# Patient Record
Sex: Female | Born: 1955 | Race: White | Hispanic: No | Marital: Married
Health system: Southern US, Community
[De-identification: ages and names within clinical notes are randomized; demographics above are authoritative.]

---

## 2012-06-26 ENCOUNTER — Other Ambulatory Visit: Payer: Self-pay | Admitting: Family Medicine

## 2012-06-26 DIAGNOSIS — Z1231 Encounter for screening mammogram for malignant neoplasm of breast: Secondary | ICD-10-CM

## 2012-06-26 DIAGNOSIS — N951 Menopausal and female climacteric states: Secondary | ICD-10-CM

## 2012-08-04 ENCOUNTER — Ambulatory Visit
Admission: RE | Admit: 2012-08-04 | Discharge: 2012-08-04 | Disposition: A | Discharge status: Home / Self Care | Payer: BC Managed Care – PPO | Source: Ambulatory Visit | Attending: Family Medicine | Admitting: Family Medicine

## 2012-08-04 ENCOUNTER — Ambulatory Visit: Payer: Self-pay

## 2012-08-04 DIAGNOSIS — N951 Menopausal and female climacteric states: Secondary | ICD-10-CM

## 2012-08-04 DIAGNOSIS — Z1231 Encounter for screening mammogram for malignant neoplasm of breast: Secondary | ICD-10-CM

## 2014-06-10 ENCOUNTER — Other Ambulatory Visit: Payer: Self-pay | Admitting: Internal Medicine

## 2014-06-10 DIAGNOSIS — M810 Age-related osteoporosis without current pathological fracture: Secondary | ICD-10-CM

## 2014-06-10 DIAGNOSIS — Z1231 Encounter for screening mammogram for malignant neoplasm of breast: Secondary | ICD-10-CM

## 2014-08-06 ENCOUNTER — Ambulatory Visit
Admission: RE | Admit: 2014-08-06 | Discharge: 2014-08-06 | Disposition: A | Discharge status: Home / Self Care | Payer: BC Managed Care – PPO | Source: Ambulatory Visit | Attending: Internal Medicine | Admitting: Internal Medicine

## 2014-08-06 DIAGNOSIS — M810 Age-related osteoporosis without current pathological fracture: Secondary | ICD-10-CM

## 2014-08-06 DIAGNOSIS — Z1231 Encounter for screening mammogram for malignant neoplasm of breast: Secondary | ICD-10-CM

## 2017-05-06 ENCOUNTER — Other Ambulatory Visit: Payer: Self-pay | Admitting: Family Medicine

## 2017-05-06 DIAGNOSIS — M810 Age-related osteoporosis without current pathological fracture: Secondary | ICD-10-CM

## 2017-05-08 ENCOUNTER — Encounter (INDEPENDENT_AMBULATORY_CARE_PROVIDER_SITE_OTHER): Payer: Self-pay

## 2017-05-08 ENCOUNTER — Other Ambulatory Visit: Payer: Self-pay | Admitting: Family Medicine

## 2017-05-08 ENCOUNTER — Ambulatory Visit
Admission: RE | Admit: 2017-05-08 | Discharge: 2017-05-08 | Disposition: A | Discharge status: Home / Self Care | Payer: BLUE CROSS/BLUE SHIELD | Source: Ambulatory Visit | Attending: Family Medicine | Admitting: Family Medicine

## 2017-05-08 DIAGNOSIS — M79672 Pain in left foot: Secondary | ICD-10-CM

## 2017-05-08 DIAGNOSIS — M810 Age-related osteoporosis without current pathological fracture: Secondary | ICD-10-CM

## 2017-05-22 ENCOUNTER — Other Ambulatory Visit: Payer: Self-pay

## 2017-05-31 ENCOUNTER — Ambulatory Visit (INDEPENDENT_AMBULATORY_CARE_PROVIDER_SITE_OTHER): Payer: BLUE CROSS/BLUE SHIELD | Admitting: Sports Medicine

## 2017-05-31 DIAGNOSIS — M19072 Primary osteoarthritis, left ankle and foot: Secondary | ICD-10-CM

## 2017-05-31 NOTE — Progress Notes (Signed)
Chief complaint: Left foot pain 6 weeks  History of present illness: Yolanda Gonzalez is a 61 year old female who presents to the sports medicine office today with chief complaint of left dorsal foot pain. She reports that symptoms have been ongoing for the last 6 weeks. She points to an incident that occurred 6 weeks ago. She reports that she was putting on a new pair of dress boots at time of incidence. She reports the same day noticing gradual onset and worsening of pain on the dorsal aspect of her left foot. Over the last 6 weeks she describes the pain as a sharp and throbbing pain. She reports any type pressure applied in the left foot is when she feels the pain. She does not report of any numbness, tingling, or burning parasthesias. She reports that she did follow up with her primary physician's office. She reports that x-ray was done, was told that she had osteoarthritis. Due to the pain she was having she placed in a CAM walker boot. She reports that the CAM walker boot made symptoms worse. She reports that she return to her primary physician's office, was placed in postoperative shoe. Repeat x-rays were done, she was kindly referred subsequently here for further follow-up of her foot pain. She reports that she has been mainly resting and not doing much walking in the last week. She overall feels symptoms are getting better and actually contemplated of canceling the appointment today. She reports having effusion, but has dramatically improved over the last week. She does not report of any fevers, chills, or night sweats. On same foot she does have history of bunionectomy, also had history of left 3rd middle phalangeal stress fracture several years ago. She reports that pain is in similar area today. She does have known history of osteoporosis, has previously been on bisphosphonates. This was stopped a few years ago and now she is just on calcium and vitamin D supplements. She recently had DEXA scan, she reports  that her T-scores have remained unchanged.  Review of systems:  As stated above  Her past medical history, surgical history, family history, and social history obtained reviewed. 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 Family history is noncontributory to current complaint. She does not report of any current tobacco use.  Physical exam: Vital signs are reviewed and are documented in the chart Gen.: Alert, oriented, appears stated age, in no apparent distress HEENT: Moist oral mucosa Respiratory: Normal respirations, able to speak in full sentences Cardiac: Regular rate, distal pulses 2+ Integumentary: No rashes on visible skin:  Neurologic: Strength 5/5, sensation 2+ in bilateral lower extremities Psych: Normal affect, mood is described as good Musculoskeletal: Inspection of her left foot reveals no obvious deformity or muscle atrophy, she does have trace effusion on the dorsal aspect of her left foot, otherwise no warmth, erythema, ecchymosis, she is tender to palpation in the midfoot of the left foot near the proximal second and third metatarsal as well as cuneiform bones, this is just from dorsal aspect, no tenderness to palpation plantar aspect of foot, no tenderness to palpation over Lisfranc, navicular, or base of fifth metatarsal, she does have full ankle passive and active of range of motion, she also does have flattening transverse arch bilaterally, also noticed that the fourth and fifth digit in both feet are not fully touching the  ground and 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 Based on limited musculoskeletal ultrasound evaluation, was evidence of midfoot osteoarthritis, with slight  spurring seen in the midfoot. No evidence to suggest stress fracture.  Impression: Midfoot osteoarthritis  Assessment and plan: 1. Left foot pain, suspect from midfoot osteoarthritis 2. Bilateral flattened transverse arch  Left foot pain -Did personally review x-rays that were done in the office at her primary care physician's office 3 weeks ago, do not see any evidence of stress fracture, given the timeframe of her symptoms would've expected to see a stress fracture at the time of that x-ray -Suspect her symptoms are coming from midfoot osteoarthritis -Did have patient fitted with green insole, with metatarsal pad on left foot, this was fitted to her satisfaction -She was given note for work to wear tennis shoes for the next 6 weeks  She will follow-up on as-needed basis if no interval improvement in her symptoms.  Haynes Kerns, M.D. Primary Care Sports Medicine Fellow [facility]

## 2018-09-29 ENCOUNTER — Other Ambulatory Visit: Payer: Self-pay | Admitting: Family Medicine

## 2018-09-29 DIAGNOSIS — Z1231 Encounter for screening mammogram for malignant neoplasm of breast: Secondary | ICD-10-CM

## 2018-10-29 ENCOUNTER — Ambulatory Visit: Payer: BLUE CROSS/BLUE SHIELD

## 2018-11-26 ENCOUNTER — Ambulatory Visit: Payer: BLUE CROSS/BLUE SHIELD

## 2019-03-10 ENCOUNTER — Ambulatory Visit: Payer: BLUE CROSS/BLUE SHIELD

## 2019-04-20 ENCOUNTER — Ambulatory Visit: Payer: BLUE CROSS/BLUE SHIELD

## 2019-06-03 ENCOUNTER — Ambulatory Visit
Admission: RE | Admit: 2019-06-03 | Discharge: 2019-06-03 | Disposition: A | Discharge status: Home / Self Care | Payer: BC Managed Care – PPO | Source: Ambulatory Visit | Attending: Family Medicine | Admitting: Family Medicine

## 2019-06-03 ENCOUNTER — Other Ambulatory Visit: Payer: Self-pay

## 2019-06-03 DIAGNOSIS — Z1231 Encounter for screening mammogram for malignant neoplasm of breast: Secondary | ICD-10-CM

## 2020-07-23 IMAGING — MG DIGITAL SCREENING BILAT W/ CAD
4 series · 4 of 4 positions shown · non-contrast
Comparison: Previous exam(s).

CLINICAL DATA: Screening.

EXAM:
DIGITAL SCREENING BILATERAL MAMMOGRAM WITH CAD

[L CC]
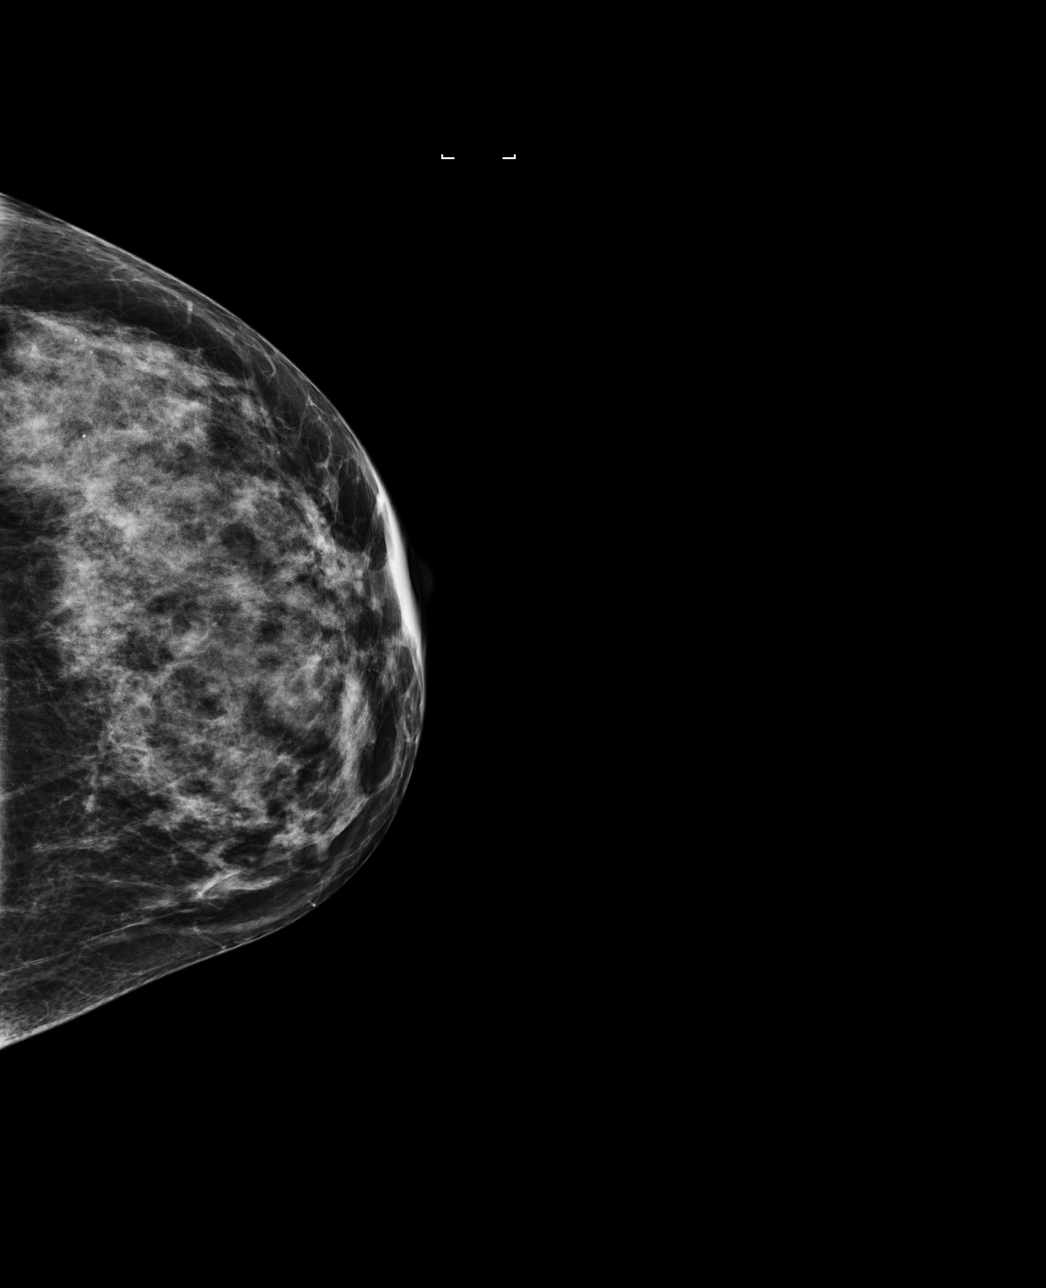

[R MLO]
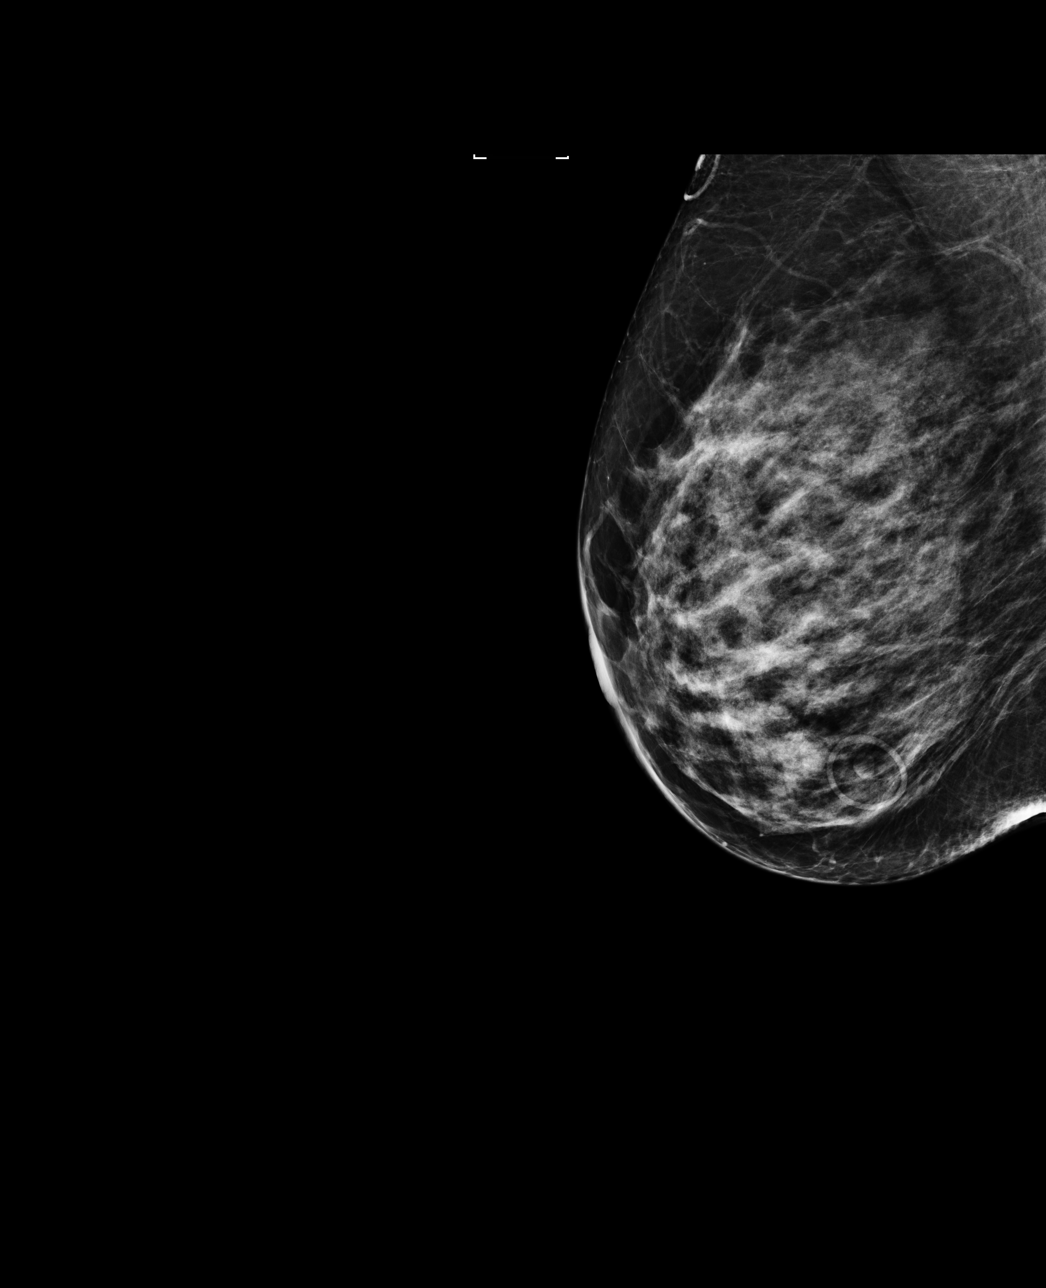

[L MLO]
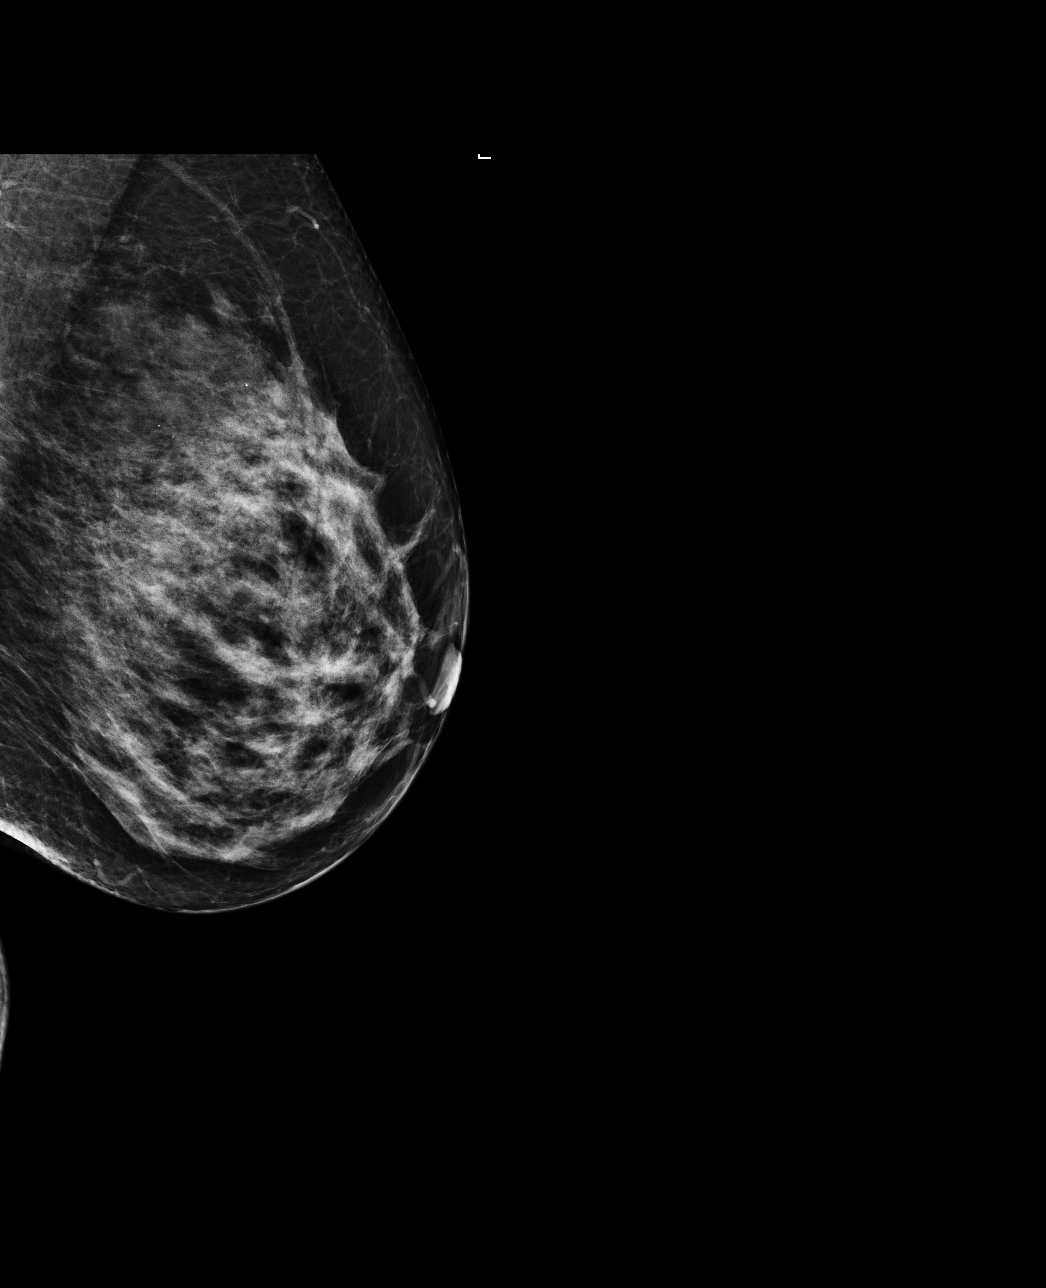

[R CC]
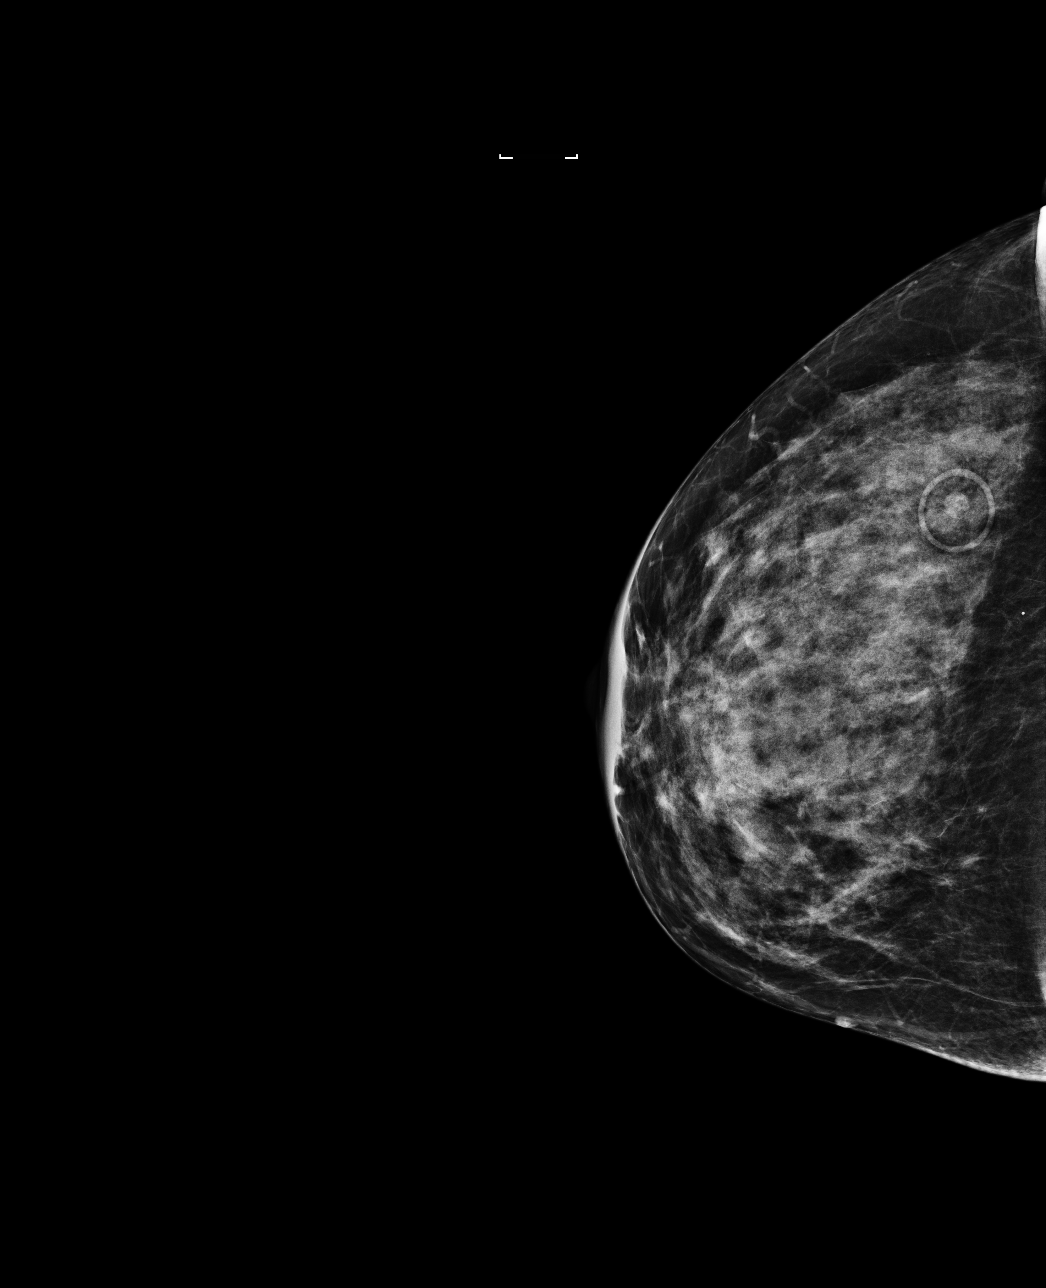

[4 of 4 positions shown; findings below may reference images not displayed]

ACR Breast Density Category d: The breast tissue is extremely dense,
which lowers the sensitivity of mammography
FINDINGS: There are no findings suspicious for malignancy. Images were
processed with CAD.
IMPRESSION: No mammographic evidence of malignancy. A result letter of this
screening mammogram will be mailed directly to the patient.

RECOMMENDATION:
Screening mammogram in one year. (Code:A5-2-HPS)

BI-RADS CATEGORY  1: Negative.

## 2023-07-22 ENCOUNTER — Encounter: Payer: Self-pay | Admitting: Physician Assistant

## 2023-08-10 NOTE — Progress Notes (Addendum)
[facility] Clinic Note - Initial Visit   Date: 08/13/23  Yolanda Gonzalez MRN: [id number] DOB: [birth year]   Acute right thalamic stroke, likely due to small vessel disease  Return to clinic in 3 months. Recommend good control of cardiovascular risk factors.  Patient is on Lipitor, she has not been on blood pressure medication which is recommended to maintain good normotensive state.  She was informed on her visit. Continue baby aspirin daily Discontinue any over-the-counter hormonal supplements Agree with compression stockings when traveling   History of Present Illness:  Yolanda Gonzalez is a 67 y.o. R-handed female with a history of hypothyroidism, fibromyalgia, osteoporosis seen today for evaluation of acute thalamic stroke. In review, on 07/15/23 she presented to the ED with acute R , sharp temporal headache followed by L sided arm and leg numbness and weakness, left facial droop while at a friend's house.  911 was called that she was evaluated at the ED.  At the ED she receive migraine cocktail, CT head and CTA head and neck where negative. MRI brain was remarkable for acute non hemorrhagic right thalamic capsular infarct measuring about 1 cm, with accelerated chronic microvascular ischemic disease.  2D echo was normal.  EKG without arrhythmia or acute findings. She was discharged on Plavix and baby ASA for 3 weeks then baby ASA alone.  She continues to experience mild numbness on the left side of her body from the left trunk to the left triceps area but she reports that this is improving.  She never had a stroke, but had "temple headaches for the last 1.5 y or 2 years ".  Denies any history of TIA. Denies vertigo but he "world was floating, was losing body control". She was nauseous and dizzy.  Denies  a history of headaches. During the presentation she had dysarthria, no dysphagia. No confusion. No seizures. Denies any chest pain, or shortness of  breath.  "My BP is high frequently when stressed ". Denies any fever or chills, or night sweats. No tobacco. No new meds or hormonal supplements. Takes over-the-counter hormone support which includes DHEA  ("Dr. Jonathon Resides").   She drives to [place] to her second home there, last trip 07/01/2023-07/06/23. Denies recent surgeries. No sick contacts. No new stressors present in personal life. Patient is compliant with her medications. Patient is very active, exercising daily. She is not a diabetic.  History of stroke in father and MGM. Patient was not administered TPA , but received Lovenox at the ED.   Out-side paper records, electronic medical record, and images have been reviewed where available and summarized as:    Pertinent labs TSH 1.340, sed rate 3, CRP 0.05, magnesium 2.3, troponin 4 (07/15/2023) glucose 131, CBC was unremarkable, total cholesterol 259, triglycerides 53, HDL 132.7, LDL 84, C-Met unremarkable,  CT of the head without contrast performed at [facility] ED was negative for acute findings other than chronic microvascular ischemic changes.  CT angio of the head and neck without LVO, or aneurysm.  Bilateral carotid arteries without significant stenosis or occlusion  MRI of the brain on 11/27/2024Showed acute nonhemorrhagic right thalamic capsular infarct measuring approximately 1 cm, and T2 FLAIR hyperintensity in the subcortical and periventricular white matter compatible with accelerated chronic microvascular ischemic disease.  She did have a  echo but the results are not available for review, pending follow.     Medications:  Outpatient Encounter Medications as of 08/13/2023  Medication Sig   aspirin EC 325 MG tablet Take  325 mg by mouth daily.   atorvastatin (LIPITOR) 40 MG tablet Take 40 mg by mouth at bedtime.   pantoprazole (PROTONIX) 40 MG tablet Take 40 mg by mouth daily.   No facility-administered encounter medications on file as of 08/13/2023.    Allergies:   Allergies  Allergen Reactions   Sulfa Antibiotics Hives    Family History: History reviewed. No pertinent family history.  Social History:   Social History   Social History Narrative   Right handed   Drinks caffeine   Lives with husband and two sons   Two floor home   retired    Vital Signs:  BP (!) 146/92   Pulse 81   Resp 18   Wt 133 lb (60.3 kg)   SpO2 98%   BMI 23.56 kg/m    General Medical Exam:   General:  Well appearing, comfortable.   Eyes/ENT: see cranial nerve examination.   Neck:   No carotid bruits. Respiratory:  Clear to auscultation, good air entry bilaterally.   Cardiac:  Regular rate and rhythm, no murmur.   Extremities:  No deformities, edema, or skin discoloration.  Skin:  No rashes or lesions.  Neurological Exam: MENTAL STATUS including orientation to time, place, person, recent and remote memory, attention span and concentration, language, and fund of knowledge is normal.  Speech is not dysarthric.  CRANIAL NERVES: II:  No visual field defects.  Unremarkable fundi.   III-IV-VI: Pupils equal round and reactive to light.  Normal conjugate, extra-ocular eye movements in all directions of gaze.  No nystagmus.  No ptosis .   V:  Normal facial sensation.    VII:  Normal facial symmetry and movements.   VIII:  Normal hearing and vestibular function.   IX-X:  Normal palatal movement.   XI:  Normal shoulder shrug and head rotation.   XII:  Normal tongue strength and range of motion, no deviation or fasciculation.  MOTOR:  No atrophy, fasciculations or abnormal movements.  No pronator drift.    SENSORY:  Normal and symmetric perception of light touch, pinprick, vibration, and proprioception.  Romberg's sign absent.   COORDINATION/GAIT: Normal finger-to- nose-finger and heel-to-shin.  Intact rapid alternating movements bilaterally.  Able to rise from a chair without using arms.  Gait narrow based and stable. Tandem and stressed gait intact.      Total time spent:  60 mis   Thank you for allowing me to participate in patient's care.  If I can answer any additional questions, I would be pleased to do so.    Sincerely,   Marlowe Kays, PA-C

## 2023-08-13 ENCOUNTER — Encounter: Payer: Self-pay | Admitting: Physician Assistant

## 2023-08-13 ENCOUNTER — Ambulatory Visit (INDEPENDENT_AMBULATORY_CARE_PROVIDER_SITE_OTHER): Payer: BC Managed Care – PPO | Admitting: Physician Assistant

## 2023-08-13 VITALS — BP 177/91 | HR 81 | Resp 18 | Wt 133.0 lb

## 2023-08-13 DIAGNOSIS — I639 Cerebral infarction, unspecified: Secondary | ICD-10-CM | POA: Insufficient documentation

## 2023-08-13 DIAGNOSIS — R200 Anesthesia of skin: Secondary | ICD-10-CM | POA: Diagnosis not present

## 2023-08-13 MED ORDER — ATORVASTATIN CALCIUM 40 MG PO TABS: 40.0000 mg | ORAL_TABLET | Freq: Every day | ORAL | 0 refills | Status: DC

## 2023-08-13 NOTE — Patient Instructions (Addendum)
Continue baby ASA Continue Lipitor daily Make sure BP is improved No more over the counter hormone suplements PT for strength and balance at [facility] 3 months

## 2023-09-04 ENCOUNTER — Other Ambulatory Visit: Payer: Self-pay | Admitting: Physician Assistant

## 2023-09-09 ENCOUNTER — Encounter: Payer: Self-pay | Admitting: Physician Assistant

## 2023-09-12 ENCOUNTER — Other Ambulatory Visit: Payer: Self-pay | Admitting: Family Medicine

## 2023-09-12 DIAGNOSIS — Z1231 Encounter for screening mammogram for malignant neoplasm of breast: Secondary | ICD-10-CM

## 2023-11-07 ENCOUNTER — Ambulatory Visit
Admission: RE | Admit: 2023-11-07 | Discharge: 2023-11-07 | Disposition: A | Discharge status: Home / Self Care | Source: Ambulatory Visit | Attending: Family Medicine | Admitting: Family Medicine

## 2023-11-07 DIAGNOSIS — Z1231 Encounter for screening mammogram for malignant neoplasm of breast: Secondary | ICD-10-CM

## 2023-11-07 NOTE — Progress Notes (Signed)
 [facility] Clinic Note - Follow up visit   Date: 11/08/23  Yolanda Gonzalez MRN: [id number] DOB: Jun 20, 1956   Acute right thalamic stroke, likely due to small vessel disease   Recommend good control of cardiovascular risk factors.   Patient is on Lipitor, she has not been on blood pressure medication which is recommended to maintain good normotensive state.She was informed of abnormal BP today    Continue baby aspirin daily. Discontinue any over-the-counter hormonal supplements. Agree with compression stockings when traveling. Recommend continuing PT/OT for L arm residual symptoms    History of Present Illness 08/13/23: Yolanda Gonzalez is a 68 y.o. R-handed female with a history of hypothyroidism, fibromyalgia, osteoporosis seen today for evaluation of acute thalamic stroke. In review, on 07/15/23 she presented to the ED with acute R , sharp temporal headache followed by L sided arm and leg numbness and weakness, left facial droop while at a friend's house.  911 was called that she was evaluated at the ED.  At the ED she received migraine cocktail, CT head and CTA head and neck where negative. MRI brain was remarkable for acute non hemorrhagic right thalamic capsular infarct measuring about 1 cm, with accelerated chronic microvascular ischemic disease.  2D echo was normal.  EKG without arrhythmia or acute findings. She was discharged on Plavix and baby ASA for 3 weeks then baby ASA alone.  She continues to experience mild numbness on the left side of her body from the left trunk to the left triceps area but she reports that this is improving.  She never had a stroke, but had "temple headaches for the last 1.5 y or 2 years ".  Denies any history of TIA. Denies vertigo but he "world was floating, was losing body control". She was nauseous and dizzy.  Denies  a history of headaches. During the presentation she had dysarthria, no dysphagia. No confusion. No  seizures. Denies any chest pain, or shortness of breath.  "My BP is high frequently when stressed ". Denies any fever or chills, or night sweats. No tobacco. No new meds or hormonal supplements. Takes over-the-counter hormone support which includes DHEA  ("Dr. Jonathon Resides").   She drives to [place] to her second home there, last trip 07/01/2023-07/06/23. Denies recent surgeries. No sick contacts. No new stressors present in personal life. Patient is compliant with her medications. Patient is very active, exercising daily. She is not a diabetic.  History of stroke in father and MGM. Patient was not administered TPA , but received Lovenox at the ED.   Out-side paper records, electronic medical record, and images have been reviewed where available and summarized as:    Pertinent labs TSH 1.340, sed rate 3, CRP 0.05, magnesium 2.3, troponin 4 (07/15/2023) glucose 131, CBC was unremarkable, total cholesterol 259, triglycerides 53, HDL 132.7, LDL 84, C-Met unremarkable,  CT of the head without contrast performed at [facility] ED was negative for acute findings other than chronic microvascular ischemic changes.  CT angio of the head and neck without LVO, or aneurysm.  Bilateral carotid arteries without significant stenosis or occlusion  MRI of the brain on 11/27/2024Showed acute nonhemorrhagic right thalamic capsular infarct measuring approximately 1 cm, and T2 FLAIR hyperintensity in the subcortical and periventricular white matter compatible with accelerated chronic microvascular ischemic disease.    Update : She had been well, no further stroke like symptoms.She continues to have residual "tingling and numbness sensation on the L arm, hands".  Denies vertigo dizziness or  vision changes. Denies headaches, dysarthria or dysphagia. No confusion or seizures. Denies any chest pain, or shortness of breath. Denies any fever or chills, or night sweats. No tobacco. No new meds or hormonal supplements. Does take a  regular ASA a day, with no other antiplatelets or anticoagulants. No sick contacts. She tries to remain active.  Patient is compliant with  medications. Did PT/OT until recently, she states that it was beneficial  Medications:  Outpatient Encounter Medications as of 11/08/2023  Medication Sig   [DISCONTINUED] atorvastatin (LIPITOR) 40 MG tablet TAKE 1 TABLET BY MOUTH EVERYDAY AT BEDTIME   5-Hydroxytryptophan 100 MG CAPS as directed Orally twice a day   aspirin EC 325 MG tablet Take 325 mg by mouth daily.   atorvastatin (LIPITOR) 40 MG tablet Take 1 tablet (40 mg total) by mouth daily.   pantoprazole (PROTONIX) 40 MG tablet Take 40 mg by mouth daily.   No facility-administered encounter medications on file as of 11/08/2023.    Allergies:  Allergies  Allergen Reactions   Sulfa Antibiotics Hives   Gluten Meal     Other Reaction(s): GI issues/itching    Family History: History reviewed. No pertinent family history.  Social History:   Social History   Social History Narrative   Right handed   Drinks caffeine   Lives with husband and two sons   Two floor home   retired    Vital Signs:  BP (!) 171/111   Pulse 72   Resp 20   Wt 138 lb (62.6 kg)   SpO2 98%   BMI 24.45 kg/m    General Medical Exam:   General:  Well appearing, comfortable.   Eyes/ENT: see cranial nerve examination.   Neck:   No carotid bruits. Respiratory:  Clear to auscultation, good air entry bilaterally.   Cardiac:  Regular rate and rhythm, no murmur.   Extremities:  No deformities, edema, or skin discoloration.  Skin:  No rashes or lesions.  Neurological Exam: MENTAL STATUS including orientation to time, place, person, recent and remote memory, attention span and concentration, language, and fund of knowledge is normal.  Speech is not dysarthric.  CRANIAL NERVES: II:  No visual field defects.  Unremarkable fundi.   III-IV-VI: Pupils equal round and reactive to light.  Normal conjugate, extra-ocular  eye movements in all directions of gaze.  No nystagmus.  No ptosis .   V:  Normal facial sensation.    VII:  Normal facial symmetry and movements.   VIII:  Normal hearing and vestibular function.   IX-X:  Normal palatal movement.   XI:  Normal shoulder shrug and head rotation.   XII:  Normal tongue strength and range of motion, no deviation or fasciculation.  MOTOR:  No atrophy, fasciculations or abnormal movements.  No pronator drift.    SENSORY:  Normal and symmetric perception of light touch, pinprick, vibration, and proprioception except on L digits up to L forearm..  Romberg's sign absent.   COORDINATION/GAIT: Normal finger-to- nose-finger and heel-to-shin.  Intact rapid alternating movements bilaterally.  Able to rise from a chair without using arms.  Gait narrow based and stable. Tandem and stressed gait intact.     Total time spent: 22 mis   Thank you for allowing me to participate in patient's care.  If I can answer any additional questions, I would be pleased to do so.    Sincerely,   Marlowe Kays, PA-C

## 2023-11-08 ENCOUNTER — Encounter: Payer: Self-pay | Admitting: Physician Assistant

## 2023-11-08 ENCOUNTER — Ambulatory Visit (INDEPENDENT_AMBULATORY_CARE_PROVIDER_SITE_OTHER): Payer: BC Managed Care – PPO | Admitting: Physician Assistant

## 2023-11-08 VITALS — BP 171/111 | HR 72 | Resp 20 | Wt 138.0 lb

## 2023-11-08 DIAGNOSIS — I639 Cerebral infarction, unspecified: Secondary | ICD-10-CM | POA: Diagnosis not present

## 2023-11-08 DIAGNOSIS — R200 Anesthesia of skin: Secondary | ICD-10-CM | POA: Diagnosis not present

## 2023-11-08 MED ORDER — ATORVASTATIN CALCIUM 40 MG PO TABS: 40.0000 mg | ORAL_TABLET | Freq: Every day | ORAL | 0 refills | Status: AC

## 2023-11-08 NOTE — Patient Instructions (Signed)
 Continue baby ASA Continue Lipitor daily Make sure BP is improved, may consider cardiology for BP evaluation  No more over the counter hormone suplements Continue PT for strength and balance at [facility]

## 2023-11-12 ENCOUNTER — Ambulatory Visit: Payer: BC Managed Care – PPO | Admitting: Physician Assistant
